# Patient Record
Sex: Female | Born: 1990 | Race: White | Hispanic: No | Marital: Single | Smoking: Former smoker
Health system: Southern US, Community
[De-identification: ages and names within clinical notes are randomized; demographics above are authoritative.]

## PROBLEM LIST (undated history)

## (undated) DIAGNOSIS — F32A Depression, unspecified: Secondary | ICD-10-CM

## (undated) DIAGNOSIS — F329 Major depressive disorder, single episode, unspecified: Secondary | ICD-10-CM

---

## 2016-06-21 ENCOUNTER — Emergency Department (HOSPITAL_COMMUNITY): Payer: No Typology Code available for payment source

## 2016-06-21 ENCOUNTER — Encounter (HOSPITAL_COMMUNITY): Payer: Self-pay

## 2016-06-21 ENCOUNTER — Emergency Department (HOSPITAL_COMMUNITY)
Admission: EM | Admit: 2016-06-21 | Discharge: 2016-06-22 | Disposition: A | Discharge status: Home / Self Care | Payer: No Typology Code available for payment source | Attending: Emergency Medicine | Admitting: Emergency Medicine

## 2016-06-21 DIAGNOSIS — S301XXA Contusion of abdominal wall, initial encounter: Secondary | ICD-10-CM | POA: Diagnosis not present

## 2016-06-21 DIAGNOSIS — Y92410 Unspecified street and highway as the place of occurrence of the external cause: Secondary | ICD-10-CM | POA: Diagnosis not present

## 2016-06-21 DIAGNOSIS — S2221XA Fracture of manubrium, initial encounter for closed fracture: Secondary | ICD-10-CM | POA: Insufficient documentation

## 2016-06-21 DIAGNOSIS — Z87891 Personal history of nicotine dependence: Secondary | ICD-10-CM | POA: Diagnosis not present

## 2016-06-21 DIAGNOSIS — S2220XA Unspecified fracture of sternum, initial encounter for closed fracture: Secondary | ICD-10-CM

## 2016-06-21 DIAGNOSIS — Y939 Activity, unspecified: Secondary | ICD-10-CM | POA: Diagnosis not present

## 2016-06-21 DIAGNOSIS — S299XXA Unspecified injury of thorax, initial encounter: Secondary | ICD-10-CM | POA: Diagnosis present

## 2016-06-21 DIAGNOSIS — R935 Abnormal findings on diagnostic imaging of other abdominal regions, including retroperitoneum: Secondary | ICD-10-CM | POA: Diagnosis not present

## 2016-06-21 DIAGNOSIS — Y999 Unspecified external cause status: Secondary | ICD-10-CM | POA: Diagnosis not present

## 2016-06-21 HISTORY — DX: Depression, unspecified: F32.A

## 2016-06-21 HISTORY — DX: Major depressive disorder, single episode, unspecified: F32.9

## 2016-06-21 LAB — CBC WITH DIFFERENTIAL/PLATELET
Basophils Absolute: 0 10*3/uL (ref 0.0–0.1)
Basophils Relative: 0 %
EOS PCT: 0 %
Eosinophils Absolute: 0 10*3/uL (ref 0.0–0.7)
HCT: 32.3 % — ABNORMAL LOW (ref 36.0–46.0)
Hemoglobin: 10.3 g/dL — ABNORMAL LOW (ref 12.0–15.0)
LYMPHS ABS: 1.6 10*3/uL (ref 0.7–4.0)
LYMPHS PCT: 13 %
MCH: 27.3 pg (ref 26.0–34.0)
MCHC: 31.9 g/dL (ref 30.0–36.0)
MCV: 85.7 fL (ref 78.0–100.0)
MONO ABS: 0.8 10*3/uL (ref 0.1–1.0)
Monocytes Relative: 7 %
Neutro Abs: 9.3 10*3/uL — ABNORMAL HIGH (ref 1.7–7.7)
Neutrophils Relative %: 80 %
PLATELETS: 340 10*3/uL (ref 150–400)
RBC: 3.77 MIL/uL — ABNORMAL LOW (ref 3.87–5.11)
RDW: 14.4 % (ref 11.5–15.5)
WBC: 11.7 10*3/uL — ABNORMAL HIGH (ref 4.0–10.5)

## 2016-06-21 LAB — POC URINE PREG, ED: Preg Test, Ur: NEGATIVE

## 2016-06-21 LAB — I-STAT BETA HCG BLOOD, ED (MC, WL, AP ONLY): I-stat hCG, quantitative: <5 m[IU]/mL (ref <5)

## 2016-06-21 MED ORDER — IBUPROFEN 800 MG PO TABS
800.0000 mg | ORAL_TABLET | Freq: Once | ORAL | Status: AC
  Administered 2016-06-21: 800 mg via ORAL
  Filled 2016-06-21: qty 1

## 2016-06-21 MED ORDER — MORPHINE SULFATE (PF) 4 MG/ML IV SOLN
4.0000 mg | Freq: Once | INTRAVENOUS | Status: AC
Start: 2016-06-21 — End: 2016-06-21
  Administered 2016-06-21: 4 mg via INTRAVENOUS
  Filled 2016-06-21: qty 1
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
## 2016-06-21 NOTE — ED Notes (Signed)
Nurse will call if phleb need to get blood.
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
## 2016-06-22 ENCOUNTER — Encounter (HOSPITAL_COMMUNITY): Payer: Self-pay

## 2016-06-22 ENCOUNTER — Encounter (HOSPITAL_COMMUNITY): Payer: Self-pay | Admitting: Radiology

## 2016-06-22 ENCOUNTER — Emergency Department (HOSPITAL_COMMUNITY)
Admission: EM | Admit: 2016-06-22 | Discharge: 2016-06-22 | Disposition: A | Discharge status: Home / Self Care | Payer: No Typology Code available for payment source | Source: Home / Self Care | Attending: Emergency Medicine | Admitting: Emergency Medicine

## 2016-06-22 ENCOUNTER — Emergency Department (HOSPITAL_COMMUNITY): Payer: No Typology Code available for payment source

## 2016-06-22 DIAGNOSIS — R252 Cramp and spasm: Secondary | ICD-10-CM | POA: Insufficient documentation

## 2016-06-22 DIAGNOSIS — R112 Nausea with vomiting, unspecified: Secondary | ICD-10-CM | POA: Insufficient documentation

## 2016-06-22 DIAGNOSIS — Y999 Unspecified external cause status: Secondary | ICD-10-CM

## 2016-06-22 DIAGNOSIS — Y939 Activity, unspecified: Secondary | ICD-10-CM | POA: Insufficient documentation

## 2016-06-22 DIAGNOSIS — Y92410 Unspecified street and highway as the place of occurrence of the external cause: Secondary | ICD-10-CM | POA: Insufficient documentation

## 2016-06-22 DIAGNOSIS — Z87891 Personal history of nicotine dependence: Secondary | ICD-10-CM | POA: Insufficient documentation

## 2016-06-22 LAB — COMPREHENSIVE METABOLIC PANEL
ALBUMIN: 3.9 g/dL (ref 3.5–5.0)
ALK PHOS: 60 U/L (ref 38–126)
ALT: 22 U/L (ref 14–54)
ALT: 24 U/L (ref 14–54)
ANION GAP: 10 (ref 5–15)
AST: 29 U/L (ref 15–41)
AST: 27 U/L (ref 15–41)
Albumin: 4.3 g/dL (ref 3.5–5.0)
Alkaline Phosphatase: 63 U/L (ref 38–126)
Anion gap: 8 (ref 5–15)
BILIRUBIN TOTAL: 0.3 mg/dL (ref 0.3–1.2)
BUN: <5 mg/dL — ABNORMAL LOW (ref 6–20)
CALCIUM: 9.3 mg/dL (ref 8.9–10.3)
CHLORIDE: 101 mmol/L (ref 101–111)
CO2: 25 mmol/L (ref 22–32)
CO2: 26 mmol/L (ref 22–32)
CREATININE: 0.48 mg/dL (ref 0.44–1.00)
CREATININE: 0.57 mg/dL (ref 0.44–1.00)
Calcium: 9.4 mg/dL (ref 8.9–10.3)
Chloride: 103 mmol/L (ref 101–111)
GFR calc Af Amer: >60 mL/min (ref >60)
GLUCOSE: 111 mg/dL — AB (ref 65–99)
Glucose, Bld: 155 mg/dL — ABNORMAL HIGH (ref 65–99)
POTASSIUM: 3.6 mmol/L (ref 3.5–5.1)
Potassium: 4 mmol/L (ref 3.5–5.1)
SODIUM: 136 mmol/L (ref 135–145)
Sodium: 137 mmol/L (ref 135–145)
TOTAL PROTEIN: 6.5 g/dL (ref 6.5–8.1)
Total Bilirubin: 0.6 mg/dL (ref 0.3–1.2)
Total Protein: 7.4 g/dL (ref 6.5–8.1)

## 2016-06-22 LAB — CBC
HEMATOCRIT: 34.4 % — AB (ref 36.0–46.0)
HEMOGLOBIN: 11.1 g/dL — AB (ref 12.0–15.0)
MCH: 27.6 pg (ref 26.0–34.0)
MCHC: 32.3 g/dL (ref 30.0–36.0)
MCV: 85.6 fL (ref 78.0–100.0)
PLATELETS: 444 10*3/uL — AB (ref 150–400)
RBC: 4.02 MIL/uL (ref 3.87–5.11)
RDW: 14.4 % (ref 11.5–15.5)
WBC: 15.7 10*3/uL — ABNORMAL HIGH (ref 4.0–10.5)

## 2016-06-22 LAB — CBG MONITORING, ED: GLUCOSE-CAPILLARY: 125 mg/dL — AB (ref 65–99)

## 2016-06-22 MED ORDER — ACETAMINOPHEN 500 MG PO TABS
1000.0000 mg | ORAL_TABLET | Freq: Once | ORAL | Status: AC
  Administered 2016-06-22: 1000 mg via ORAL
  Filled 2016-06-22: qty 2

## 2016-06-22 MED ORDER — ONDANSETRON HCL 4 MG/2ML IJ SOLN
4.0000 mg | Freq: Once | INTRAMUSCULAR | Status: AC
Start: 2016-06-22 — End: 2016-06-22
  Administered 2016-06-22: 4 mg via INTRAVENOUS
  Filled 2016-06-22: qty 2

## 2016-06-22 MED ORDER — ONDANSETRON HCL 4 MG PO TABS: 4.0000 mg | ORAL_TABLET | Freq: Four times a day (QID) | ORAL | 0 refills | Status: AC

## 2016-06-22 MED ORDER — ONDANSETRON HCL 4 MG PO TABS
4.0000 mg | ORAL_TABLET | Freq: Once | ORAL | Status: AC
  Administered 2016-06-22: 4 mg via ORAL
  Filled 2016-06-22: qty 1

## 2016-06-22 MED ORDER — OXYCODONE-ACETAMINOPHEN 5-325 MG PO TABS: 1.0000 | ORAL_TABLET | ORAL | 0 refills | Status: AC | PRN

## 2016-06-22 MED ORDER — OXYCODONE-ACETAMINOPHEN 5-325 MG PO TABS
1.0000 | ORAL_TABLET | Freq: Once | ORAL | Status: AC
  Administered 2016-06-22: 1 via ORAL
  Filled 2016-06-22: qty 1

## 2016-06-22 MED ORDER — IOPAMIDOL (ISOVUE-370) INJECTION 76%
INTRAVENOUS | Status: AC
  Administered 2016-06-22: 100 mL
  Filled 2016-06-22: qty 100

## 2016-06-22 MED ORDER — METHOCARBAMOL 500 MG PO TABS
500.0000 mg | ORAL_TABLET | Freq: Once | ORAL | Status: AC
  Administered 2016-06-22: 500 mg via ORAL
  Filled 2016-06-22: qty 1

## 2016-06-22 MED ORDER — METHOCARBAMOL 500 MG PO TABS: 500.0000 mg | ORAL_TABLET | Freq: Two times a day (BID) | ORAL | 0 refills | Status: AC

## 2016-06-22 MED ORDER — MORPHINE SULFATE (PF) 4 MG/ML IV SOLN
4.0000 mg | Freq: Once | INTRAVENOUS | Status: AC
  Administered 2016-06-22: 4 mg via INTRAVENOUS
  Filled 2016-06-22: qty 1

## 2016-06-22 MED ORDER — NAPROXEN 500 MG PO TABS: 500.0000 mg | ORAL_TABLET | Freq: Two times a day (BID) | ORAL | 0 refills | Status: AC

## 2016-06-22 NOTE — ED Notes (Signed)
Went to go get IV out per RN and pt stated that her head was hurting again and was throbbing really bad, will inform RN.

## 2016-06-22 NOTE — ED Notes (Signed)
PT taken ginger ale, crackers, and PB
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
## 2016-06-22 NOTE — ED Notes (Signed)
ED Provider at bedside. 

## 2016-06-22 NOTE — ED Notes (Signed)
Dr. Clydene PughKnott made aware of bradycardia and HR drops to 45bpm. He is taken EKG at this time.

## 2016-06-22 NOTE — Discharge Instructions (Signed)
Take 1-2 percocet every 4 hours as needed for pain.  Take naproxen twice daily for pain.  Follow up with your primary care physician.  Return to the ED for worsening pain, shortness of breath, difficulty breathing, fever, or any new or concerning symptoms.

## 2016-06-22 NOTE — ED Notes (Signed)
Pt c/o severe headache.  PA made aware, orders obtained.
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
## 2016-06-22 NOTE — ED Notes (Signed)
Care handoff to [place], [place]
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
## 2016-06-22 NOTE — ED Notes (Signed)
PT reports nausea has improved significantly

## 2016-06-24 MED ORDER — IOPAMIDOL (ISOVUE-300) INJECTION 61%
100.0000 mL | Freq: Once | INTRAVENOUS | Status: AC | PRN
  Administered 2016-06-24: 100 mL via INTRAVENOUS

## 2016-06-28 ENCOUNTER — Encounter (HOSPITAL_COMMUNITY): Payer: Self-pay

## 2016-06-28 ENCOUNTER — Emergency Department (HOSPITAL_COMMUNITY)
Admission: EM | Admit: 2016-06-28 | Discharge: 2016-06-28 | Disposition: A | Discharge status: Home / Self Care | Payer: Self-pay | Attending: Emergency Medicine | Admitting: Emergency Medicine

## 2016-06-28 ENCOUNTER — Emergency Department (HOSPITAL_COMMUNITY): Payer: Self-pay

## 2016-06-28 DIAGNOSIS — K529 Noninfective gastroenteritis and colitis, unspecified: Secondary | ICD-10-CM | POA: Insufficient documentation

## 2016-06-28 DIAGNOSIS — G43809 Other migraine, not intractable, without status migrainosus: Secondary | ICD-10-CM | POA: Insufficient documentation

## 2016-06-28 DIAGNOSIS — Z87891 Personal history of nicotine dependence: Secondary | ICD-10-CM | POA: Insufficient documentation

## 2016-06-28 DIAGNOSIS — R072 Precordial pain: Secondary | ICD-10-CM | POA: Insufficient documentation

## 2016-06-28 LAB — CBC
HEMATOCRIT: 36.6 % (ref 36.0–46.0)
HEMOGLOBIN: 11.8 g/dL — AB (ref 12.0–15.0)
MCH: 27.2 pg (ref 26.0–34.0)
MCHC: 32.2 g/dL (ref 30.0–36.0)
MCV: 84.3 fL (ref 78.0–100.0)
PLATELETS: 517 10*3/uL — AB (ref 150–400)
RBC: 4.34 MIL/uL (ref 3.87–5.11)
RDW: 14.2 % (ref 11.5–15.5)
WBC: 9 10*3/uL (ref 4.0–10.5)

## 2016-06-28 LAB — COMPREHENSIVE METABOLIC PANEL
ALT: 15 U/L (ref 14–54)
AST: 19 U/L (ref 15–41)
Albumin: 4.5 g/dL (ref 3.5–5.0)
Alkaline Phosphatase: 67 U/L (ref 38–126)
Anion gap: 10 (ref 5–15)
BUN: <5 mg/dL — ABNORMAL LOW (ref 6–20)
CHLORIDE: 104 mmol/L (ref 101–111)
CO2: 23 mmol/L (ref 22–32)
Calcium: 10.1 mg/dL (ref 8.9–10.3)
Creatinine, Ser: 0.54 mg/dL (ref 0.44–1.00)
Glucose, Bld: 108 mg/dL — ABNORMAL HIGH (ref 65–99)
POTASSIUM: 3.9 mmol/L (ref 3.5–5.1)
SODIUM: 137 mmol/L (ref 135–145)
Total Bilirubin: 0.4 mg/dL (ref 0.3–1.2)
Total Protein: 7.9 g/dL (ref 6.5–8.1)

## 2016-06-28 LAB — URINALYSIS, ROUTINE W REFLEX MICROSCOPIC
Bilirubin Urine: NEGATIVE
GLUCOSE, UA: NEGATIVE mg/dL
Hgb urine dipstick: NEGATIVE
Ketones, ur: NEGATIVE mg/dL
LEUKOCYTES UA: NEGATIVE
Nitrite: NEGATIVE
PH: 7 (ref 5.0–8.0)
PROTEIN: NEGATIVE mg/dL
SPECIFIC GRAVITY, URINE: 1.012 (ref 1.005–1.030)

## 2016-06-28 LAB — POC URINE PREG, ED: Preg Test, Ur: NEGATIVE

## 2016-06-28 LAB — LIPASE, BLOOD: LIPASE: 21 U/L (ref 11–51)

## 2016-06-28 MED ORDER — ONDANSETRON 4 MG PO TBDP
ORAL_TABLET | ORAL | Status: AC
  Filled 2016-06-28: qty 1

## 2016-06-28 MED ORDER — SODIUM CHLORIDE 0.9 % IV BOLUS (SEPSIS)
1000.0000 mL | Freq: Once | INTRAVENOUS | Status: AC
  Administered 2016-06-28: 1000 mL via INTRAVENOUS

## 2016-06-28 MED ORDER — METOCLOPRAMIDE HCL 5 MG/ML IJ SOLN
10.0000 mg | Freq: Once | INTRAMUSCULAR | Status: AC
  Administered 2016-06-28: 10 mg via INTRAVENOUS
  Filled 2016-06-28: qty 2

## 2016-06-28 MED ORDER — DIPHENHYDRAMINE HCL 50 MG/ML IJ SOLN
25.0000 mg | Freq: Once | INTRAMUSCULAR | Status: AC
  Administered 2016-06-28: 25 mg via INTRAVENOUS
  Filled 2016-06-28: qty 1

## 2016-06-28 MED ORDER — ONDANSETRON 4 MG PO TBDP
4.0000 mg | ORAL_TABLET | Freq: Once | ORAL | Status: AC | PRN
  Administered 2016-06-28: 4 mg via ORAL
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
